# Patient Record
Sex: Female | Born: 1990 | Race: Black or African American | Hispanic: No | Marital: Single
Health system: Midwestern US, Community
[De-identification: ages and names within clinical notes are randomized; demographics above are authoritative.]

## PROBLEM LIST (undated history)

## (undated) ENCOUNTER — Inpatient Hospital Stay (HOSPITAL_COMMUNITY): Payer: Self-pay

## (undated) DIAGNOSIS — L309 Dermatitis, unspecified: Secondary | ICD-10-CM

## (undated) DIAGNOSIS — J302 Other seasonal allergic rhinitis: Secondary | ICD-10-CM

## (undated) DIAGNOSIS — R112 Nausea with vomiting, unspecified: Secondary | ICD-10-CM

---
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
## 2011-01-05 LAB — WET PREP, GENITAL

## 2011-01-20 ENCOUNTER — Emergency Department (HOSPITAL_COMMUNITY): Admission: EM | Admit: 2011-01-20 | Payer: Self-pay | Source: Home / Self Care

## 2011-03-09 ENCOUNTER — Emergency Department (HOSPITAL_COMMUNITY)
Admission: EM | Admit: 2011-03-09 | Discharge: 2011-03-09 | Disposition: A | Discharge status: Home / Self Care | Payer: Self-pay | Attending: Emergency Medicine | Admitting: Emergency Medicine

## 2011-03-09 DIAGNOSIS — N739 Female pelvic inflammatory disease, unspecified: Secondary | ICD-10-CM | POA: Insufficient documentation

## 2011-03-09 LAB — URINALYSIS, ROUTINE W REFLEX MICROSCOPIC
Bilirubin Urine: NEGATIVE
Nitrite: NEGATIVE
Specific Gravity, Urine: 1.026 (ref 1.005–1.030)
pH: 5.5 (ref 5.0–8.0)

## 2011-03-09 LAB — COMPREHENSIVE METABOLIC PANEL
CO2: 25 mEq/L (ref 19–32)
Calcium: 9 mg/dL (ref 8.4–10.5)
Chloride: 103 mEq/L (ref 96–112)
Creatinine, Ser: 0.69 mg/dL (ref 0.4–1.2)
GFR calc non Af Amer: >60 mL/min (ref >60)
Glucose, Bld: 78 mg/dL (ref 70–99)
Total Bilirubin: 0.3 mg/dL (ref 0.3–1.2)

## 2011-03-09 LAB — WET PREP, GENITAL
Trich, Wet Prep: NONE SEEN
Yeast Wet Prep HPF POC: NONE SEEN

## 2011-03-09 LAB — LIPASE, BLOOD: Lipase: 13 U/L (ref 11–59)

## 2011-03-09 LAB — DIFFERENTIAL
Basophils Absolute: 0 10*3/uL (ref 0.0–0.1)
Basophils Relative: 0 % (ref 0–1)
Eosinophils Absolute: 0.1 10*3/uL (ref 0.0–0.7)
Eosinophils Relative: 2 % (ref 0–5)
Monocytes Absolute: 0.3 10*3/uL (ref 0.1–1.0)
Neutro Abs: 3.2 10*3/uL (ref 1.7–7.7)

## 2011-03-09 LAB — URINE MICROSCOPIC-ADD ON

## 2011-03-09 LAB — CBC
MCH: 29.2 pg (ref 26.0–34.0)
MCHC: 33.5 g/dL (ref 30.0–36.0)
Platelets: 203 10*3/uL (ref 150–400)
RDW: 13.7 % (ref 11.5–15.5)

## 2011-03-09 LAB — POCT PREGNANCY, URINE: Preg Test, Ur: NEGATIVE

## 2011-03-10 LAB — GC/CHLAMYDIA PROBE AMP, GENITAL
Chlamydia, DNA Probe: NEGATIVE
GC Probe Amp, Genital: NEGATIVE

## 2011-05-02 ENCOUNTER — Inpatient Hospital Stay (INDEPENDENT_AMBULATORY_CARE_PROVIDER_SITE_OTHER)
Admission: RE | Admit: 2011-05-02 | Discharge: 2011-05-02 | Disposition: A | Discharge status: Home / Self Care | Payer: Self-pay | Source: Ambulatory Visit | Attending: Emergency Medicine | Admitting: Emergency Medicine

## 2011-05-02 DIAGNOSIS — F659 Paraphilia, unspecified: Secondary | ICD-10-CM

## 2011-05-02 DIAGNOSIS — K089 Disorder of teeth and supporting structures, unspecified: Secondary | ICD-10-CM

## 2011-05-02 DIAGNOSIS — J069 Acute upper respiratory infection, unspecified: Secondary | ICD-10-CM

## 2011-05-02 LAB — WET PREP, GENITAL: Yeast Wet Prep HPF POC: NONE SEEN

## 2011-05-03 LAB — POCT URINALYSIS DIP (DEVICE)
Glucose, UA: NEGATIVE mg/dL
Nitrite: NEGATIVE
Protein, ur: NEGATIVE mg/dL
Specific Gravity, Urine: 1.02 (ref 1.005–1.030)
Urobilinogen, UA: 1 mg/dL (ref 0.0–1.0)

## 2011-05-03 LAB — POCT PREGNANCY, URINE: Preg Test, Ur: NEGATIVE

## 2011-10-15 ENCOUNTER — Emergency Department (INDEPENDENT_AMBULATORY_CARE_PROVIDER_SITE_OTHER)
Admission: EM | Admit: 2011-10-15 | Discharge: 2011-10-15 | Disposition: A | Discharge status: Home / Self Care | Payer: Medicaid Other | Source: Home / Self Care | Attending: Emergency Medicine | Admitting: Emergency Medicine

## 2011-10-15 DIAGNOSIS — J029 Acute pharyngitis, unspecified: Secondary | ICD-10-CM

## 2011-10-15 HISTORY — DX: Dermatitis, unspecified: L30.9

## 2011-10-15 HISTORY — DX: Other seasonal allergic rhinitis: J30.2

## 2011-10-15 MED ORDER — AMOXICILLIN 500 MG PO TABS: 500.0000 mg | ORAL_TABLET | Freq: Two times a day (BID) | ORAL | Status: AC

## 2011-10-15 NOTE — ED Provider Notes (Signed)
History     CSN: [id number]  Arrival date & time 10/15/11  1350   First MD Initiated Contact with Patient 10/15/11 1355      Chief Complaint  Patient presents with  . Sore Throat  . Cough    (Consider location/radiation/quality/duration/timing/severity/associated sxs/prior treatment) HPI Comments: For the last 3 weeks been hurting on an off, this last week has been more sore and hurting with some cough and colored No fevers, but have been congested for more than a week  Patient is a 20 y.o. female presenting with pharyngitis and cough. The history is provided by the patient.  Sore Throat This is a new problem. The problem occurs constantly. The problem has not changed since onset.Pertinent negatives include no chest pain, no abdominal pain and no shortness of breath. The symptoms are aggravated by swallowing. She has tried acetaminophen for the symptoms.  Cough Pertinent negatives include no chest pain and no shortness of breath.    Past Medical History  Diagnosis Date  . Asthma   . Seasonal allergies   . Eczema     Past Surgical History  Procedure Date  . Leep     No family history on file.  History  Substance Use Topics  . Smoking status: Never Smoker   . Smokeless tobacco: Not on file  . Alcohol Use: Yes     occasional    OB History    Grav Para Term Preterm Abortions TAB SAB Ect Mult Living                  Review of Systems  Respiratory: Positive for cough. Negative for shortness of breath.   Cardiovascular: Negative for chest pain.  Gastrointestinal: Negative for abdominal pain.    Allergies  Review of patient's allergies indicates no known allergies.  Home Medications   Current Outpatient Rx  Name Route Sig Dispense Refill  . AMOXICILLIN 500 MG PO TABS Oral Take 1 tablet (500 mg total) by mouth 2 (two) times daily. 30 tablet 0    BP 116/63  Pulse 85  Temp(Src) 98.6 F (37 C) (Oral)  Resp 16  SpO2 100%  LMP 10/13/2011  Physical Exam    Nursing note and vitals reviewed. Constitutional: She appears well-developed and well-nourished. No distress.  HENT:  Head: Normocephalic.  Right Ear: Tympanic membrane normal.  Left Ear: Tympanic membrane normal.  Nose: No rhinorrhea.  Mouth/Throat: Uvula is midline and mucous membranes are normal. Posterior oropharyngeal erythema present. No posterior oropharyngeal edema.  Eyes: Conjunctivae are normal.  Neck: Neck supple.  Pulmonary/Chest: Effort normal and breath sounds normal.  Lymphadenopathy:    She has cervical adenopathy.  Neurological: She is alert.  Skin: Skin is warm. No erythema.    ED Course  Procedures (including critical care time)   Labs Reviewed  POCT RAPID STREP A ([facility] ONLY)   No results found.   1. Pharyngitis       MDM  Pharyngitis and residual cough        Jimmie Molly, MD 10/15/11 1920

## 2011-10-15 NOTE — ED Notes (Signed)
C/o sore throat and cough for 1 week.

## 2011-11-10 ENCOUNTER — Emergency Department (HOSPITAL_COMMUNITY)
Admission: EM | Admit: 2011-11-10 | Discharge: 2011-11-11 | Disposition: A | Discharge status: Home / Self Care | Payer: Medicaid Other | Attending: Emergency Medicine | Admitting: Emergency Medicine

## 2011-11-10 ENCOUNTER — Encounter (HOSPITAL_COMMUNITY): Payer: Self-pay | Admitting: Emergency Medicine

## 2011-11-10 DIAGNOSIS — N72 Inflammatory disease of cervix uteri: Secondary | ICD-10-CM

## 2011-11-10 DIAGNOSIS — J45909 Unspecified asthma, uncomplicated: Secondary | ICD-10-CM | POA: Insufficient documentation

## 2011-11-10 DIAGNOSIS — R109 Unspecified abdominal pain: Secondary | ICD-10-CM | POA: Insufficient documentation

## 2011-11-10 LAB — URINE MICROSCOPIC-ADD ON

## 2011-11-10 LAB — POCT PREGNANCY, URINE: Preg Test, Ur: NEGATIVE

## 2011-11-10 LAB — URINALYSIS, ROUTINE W REFLEX MICROSCOPIC
Glucose, UA: NEGATIVE mg/dL
Hgb urine dipstick: NEGATIVE
Specific Gravity, Urine: 1.014 (ref 1.005–1.030)
pH: 6 (ref 5.0–8.0)

## 2011-11-10 NOTE — ED Notes (Signed)
C/o intermittent sharp lower abd pain x 2 days.  Reports increased bowel movements.  Denies diarrhea.  Denies urinary complaints.

## 2011-11-11 MED ORDER — CEFTRIAXONE SODIUM 250 MG IJ SOLR
250.0000 mg | Freq: Once | INTRAMUSCULAR | Status: AC
  Administered 2011-11-11: 250 mg via INTRAMUSCULAR
  Filled 2011-11-11: qty 250

## 2011-11-11 MED ORDER — AZITHROMYCIN 250 MG PO TABS
1000.0000 mg | ORAL_TABLET | Freq: Once | ORAL | Status: AC
  Administered 2011-11-11: 1000 mg via ORAL
  Filled 2011-11-11: qty 4

## 2011-11-11 MED ORDER — ONDANSETRON 4 MG PO TBDP
4.0000 mg | ORAL_TABLET | Freq: Once | ORAL | Status: AC
  Administered 2011-11-11: 4 mg via ORAL
  Filled 2011-11-11: qty 1

## 2011-11-11 MED ORDER — METRONIDAZOLE 500 MG PO TABS: 500.0000 mg | ORAL_TABLET | Freq: Two times a day (BID) | ORAL | Status: AC

## 2011-11-11 MED ORDER — IBUPROFEN 600 MG PO TABS: 600.0000 mg | ORAL_TABLET | Freq: Four times a day (QID) | ORAL | Status: AC | PRN

## 2011-11-11 MED ORDER — LIDOCAINE HCL (PF) 1 % IJ SOLN
INTRAMUSCULAR | Status: AC
  Administered 2011-11-11: 1 mL
  Filled 2011-11-11: qty 5

## 2011-11-11 MED ORDER — TRAMADOL HCL 50 MG PO TABS
50.0000 mg | ORAL_TABLET | Freq: Once | ORAL | Status: AC
  Administered 2011-11-11: 50 mg via ORAL
  Filled 2011-11-11: qty 1

## 2011-11-11 MED ORDER — DOXYCYCLINE HYCLATE 100 MG PO CAPS: 100.0000 mg | ORAL_CAPSULE | Freq: Two times a day (BID) | ORAL | Status: AC

## 2011-11-11 NOTE — ED Notes (Signed)
Patient complaining of nausea

## 2011-11-11 NOTE — ED Provider Notes (Signed)
History     CSN: [id number]  Arrival date & time 11/10/11  2244   First MD Initiated Contact with Patient 11/11/11 0033      Chief Complaint  Patient presents with  . Abdominal Pain    (Consider location/radiation/quality/duration/timing/severity/associated sxs/prior treatment) Patient is a 21 y.o. female presenting with abdominal pain. The history is provided by the patient. No language interpreter was used.  Abdominal Pain The primary symptoms of the illness include abdominal pain and vaginal discharge. The primary symptoms of the illness do not include fever, fatigue, shortness of breath, nausea, vomiting, diarrhea or vaginal bleeding. The current episode started more than 2 days ago. The onset of the illness was gradual. The problem has not changed since onset. The abdominal pain began more than 2 days ago. The pain came on gradually. The abdominal pain has been gradually worsening since its onset. The abdominal pain is located in the suprapubic region. The abdominal pain does not radiate. The severity of the abdominal pain is 9/10. The abdominal pain is relieved by nothing. Exacerbated by: nothing.  The illness is associated with recent sexual activity. The patient states that she believes she is currently not pregnant. Risk factors: none. Symptoms associated with the illness do not include chills, anorexia, diaphoresis, constipation, urgency or frequency. Significant associated medical issues do not include HIV.    Past Medical History  Diagnosis Date  . Asthma   . Seasonal allergies   . Eczema     Past Surgical History  Procedure Date  . Leep     No family history on file.  History  Substance Use Topics  . Smoking status: Never Smoker   . Smokeless tobacco: Not on file  . Alcohol Use: Yes     occasional    OB History    Grav Para Term Preterm Abortions TAB SAB Ect Mult Living                  Review of Systems  Constitutional: Negative for fever, chills,  diaphoresis and fatigue.  HENT: Negative.   Eyes: Negative.   Respiratory: Negative for shortness of breath.   Cardiovascular: Negative.   Gastrointestinal: Positive for abdominal pain. Negative for nausea, vomiting, diarrhea, constipation and anorexia.  Genitourinary: Positive for vaginal discharge. Negative for urgency, frequency and vaginal bleeding.  Musculoskeletal: Negative.   Neurological: Negative.   Hematological: Negative.   Psychiatric/Behavioral: Negative.     Allergies  Review of patient's allergies indicates no known allergies.  Home Medications   Current Outpatient Rx  Name Route Sig Dispense Refill  . DOXYCYCLINE HYCLATE 100 MG PO CAPS Oral Take 1 capsule (100 mg total) by mouth 2 (two) times daily. 28 capsule 0  . METRONIDAZOLE 500 MG PO TABS Oral Take 1 tablet (500 mg total) by mouth 2 (two) times daily. 14 tablet 0    BP 126/79  Pulse 107  Temp(Src) 98 F (36.7 C) (Oral)  Resp 16  SpO2 96%  LMP 10/13/2011  Physical Exam  Constitutional: She is oriented to person, place, and time. She appears well-developed and well-nourished. No distress.  HENT:  Head: Normocephalic and atraumatic.  Mouth/Throat: Oropharynx is clear and moist.  Eyes: Conjunctivae are normal. Pupils are equal, round, and reactive to light.  Neck: Normal range of motion. Neck supple.  Cardiovascular: Normal rate and regular rhythm.   Pulmonary/Chest: Effort normal and breath sounds normal.  Abdominal: Soft. Bowel sounds are normal. There is no tenderness. There is no rebound and no  guarding.  Genitourinary: Cervix exhibits motion tenderness and discharge. Left adnexum displays tenderness. Vaginal discharge found.       Chaperone present  Musculoskeletal: Normal range of motion.  Neurological: She is alert and oriented to person, place, and time.  Skin: Skin is warm and dry.  Psychiatric: Thought content normal.    ED Course  Procedures (including critical care time)  Labs Reviewed   URINALYSIS, ROUTINE W REFLEX MICROSCOPIC - Abnormal; Notable for the following:    APPearance CLOUDY (*)    Leukocytes, UA TRACE (*)    All other components within normal limits  URINE MICROSCOPIC-ADD ON - Abnormal; Notable for the following:    Squamous Epithelial / LPF FEW (*)    Bacteria, UA FEW (*)    All other components within normal limits  WET PREP, GENITAL - Abnormal; Notable for the following:    Clue Cells, Wet Prep MODERATE (*)    WBC, Wet Prep HPF POC MANY (*)    All other components within normal limits  POCT PREGNANCY, URINE  POCT PREGNANCY, URINE  GC/CHLAMYDIA PROBE AMP, GENITAL   No results found.   1. Cervicitis       MDM  No sexual activity until 7 days after all partners treated take all antibiotics, no alcohol while taking flagyl.  Follow up in 1 week for repeat cultures at [facility] health department and follow up with your regular doctor for annual pap smear.  Patient verbalizes understanding and agrees to follow up       Shaquavia Whisonant Smitty Cords, MD 11/11/11 [phone]

## 2011-11-11 NOTE — Discharge Instructions (Signed)
Cervicitis Cervicitis is a soreness and swelling (inflammation) of the cervix. Your cervix is located at the bottom of your uterus which opens up to the vagina.  CAUSES   Sexually transmitted infections (STIs).   Allergic reaction.   Medicines or birth control devices that are put in the vagina.   Injury to the cervix.   Bacterial infections.  SYMPTOMS  There may be no symptoms. If symptoms occur, they may include:  Grey, white, yellow, or bad smelling vaginal discharge.   Pain or itching of the area outside the vagina.   Painful sexual intercourse.   Lower abdominal or lower back pain, especially during intercourse.   Frequent urination.   Abnormal vaginal bleeding between periods, after sexual intercourse, or after menopause.   Pressure or a heavy feeling in the pelvis.  DIAGNOSIS  Diagnosis is made after a pelvic exam. Other tests may include:  Examination of any discharge under a microscope (wet prep).   A Pap test.  TREATMENT  Treatment will depend on the cause of cervicitis. If it is caused by an STI, both you and your partner will need to be treated. Antibiotic medicines will be given. HOME CARE INSTRUCTIONS   Do not have sexual intercourse until your caregiver says it is okay.   Do not have sexual intercourse until your partner has been treated if your cervicitis is caused by an STI.   Take your antibiotics as directed. Finish them even if you start to feel better.  SEEK IMMEDIATE MEDICAL CARE IF:   Your symptoms come back.   You have a fever.   You experience any problems that may be related to the medicine you are taking.  MAKE SURE YOU:   Understand these instructions.   Will watch your condition.   Will get help right away if you are not doing well or get worse.  Document Released: 10/11/2005 Document Revised: 06/23/2011 Document Reviewed: 05/10/2011 [employer] Patient Information 2012 [employer]. 

## 2012-11-08 ENCOUNTER — Encounter (HOSPITAL_COMMUNITY): Payer: Self-pay | Admitting: Emergency Medicine

## 2012-11-08 ENCOUNTER — Emergency Department (HOSPITAL_COMMUNITY)
Admission: EM | Admit: 2012-11-08 | Discharge: 2012-11-08 | Disposition: A | Discharge status: Home / Self Care | Payer: Self-pay | Attending: Emergency Medicine | Admitting: Emergency Medicine

## 2012-11-08 ENCOUNTER — Emergency Department (HOSPITAL_COMMUNITY): Payer: Self-pay

## 2012-11-08 DIAGNOSIS — O9989 Other specified diseases and conditions complicating pregnancy, childbirth and the puerperium: Secondary | ICD-10-CM | POA: Insufficient documentation

## 2012-11-08 DIAGNOSIS — J45909 Unspecified asthma, uncomplicated: Secondary | ICD-10-CM | POA: Insufficient documentation

## 2012-11-08 DIAGNOSIS — O210 Mild hyperemesis gravidarum: Secondary | ICD-10-CM | POA: Insufficient documentation

## 2012-11-08 DIAGNOSIS — R1110 Vomiting, unspecified: Secondary | ICD-10-CM

## 2012-11-08 DIAGNOSIS — R059 Cough, unspecified: Secondary | ICD-10-CM | POA: Insufficient documentation

## 2012-11-08 DIAGNOSIS — R05 Cough: Secondary | ICD-10-CM | POA: Insufficient documentation

## 2012-11-08 DIAGNOSIS — Z872 Personal history of diseases of the skin and subcutaneous tissue: Secondary | ICD-10-CM | POA: Insufficient documentation

## 2012-11-08 DIAGNOSIS — Z9889 Other specified postprocedural states: Secondary | ICD-10-CM | POA: Insufficient documentation

## 2012-11-08 LAB — URINALYSIS, ROUTINE W REFLEX MICROSCOPIC
Bilirubin Urine: NEGATIVE
Hgb urine dipstick: NEGATIVE
Nitrite: NEGATIVE
Protein, ur: NEGATIVE mg/dL
Specific Gravity, Urine: 1.011 (ref 1.005–1.030)
Urobilinogen, UA: 0.2 mg/dL (ref 0.0–1.0)

## 2012-11-08 LAB — COMPREHENSIVE METABOLIC PANEL
ALT: 10 U/L (ref 0–35)
CO2: 23 mEq/L (ref 19–32)
Calcium: 9.1 mg/dL (ref 8.4–10.5)
Chloride: 97 mEq/L (ref 96–112)
GFR calc Af Amer: >90 mL/min (ref >90)
GFR calc non Af Amer: >90 mL/min (ref >90)
Glucose, Bld: 110 mg/dL — ABNORMAL HIGH (ref 70–99)
Sodium: 130 mEq/L — ABNORMAL LOW (ref 135–145)
Total Bilirubin: 0.3 mg/dL (ref 0.3–1.2)

## 2012-11-08 LAB — CBC WITH DIFFERENTIAL/PLATELET
Eosinophils Relative: 1 % (ref 0–5)
HCT: 38.2 % (ref 36.0–46.0)
Lymphocytes Relative: 13 % (ref 12–46)
Lymphs Abs: 0.9 10*3/uL (ref 0.7–4.0)
MCV: 87.4 fL (ref 78.0–100.0)
Monocytes Absolute: 1 10*3/uL (ref 0.1–1.0)
Neutro Abs: 4.8 10*3/uL (ref 1.7–7.7)
Platelets: 162 10*3/uL (ref 150–400)
RBC: 4.37 MIL/uL (ref 3.87–5.11)
WBC: 6.6 10*3/uL (ref 4.0–10.5)

## 2012-11-08 MED ORDER — SODIUM CHLORIDE 0.9 % IV BOLUS (SEPSIS)
2000.0000 mL | Freq: Once | INTRAVENOUS | Status: AC
  Administered 2012-11-08: 2000 mL via INTRAVENOUS

## 2012-11-08 MED ORDER — ACETAMINOPHEN 500 MG PO TABS
1000.0000 mg | ORAL_TABLET | Freq: Once | ORAL | Status: AC
Start: 2012-11-08 — End: 2012-11-08
  Administered 2012-11-08: 1000 mg via ORAL
  Filled 2012-11-08: qty 2

## 2012-11-08 MED ORDER — POTASSIUM CHLORIDE CRYS ER 20 MEQ PO TBCR
20.0000 meq | EXTENDED_RELEASE_TABLET | Freq: Once | ORAL | Status: AC
  Administered 2012-11-08: 20 meq via ORAL
  Filled 2012-11-08: qty 1

## 2012-11-08 MED ORDER — METOCLOPRAMIDE HCL 5 MG/ML IJ SOLN
10.0000 mg | Freq: Once | INTRAMUSCULAR | Status: AC
  Administered 2012-11-08: 10 mg via INTRAVENOUS
  Filled 2012-11-08: qty 2

## 2012-11-08 MED ORDER — METOCLOPRAMIDE HCL 10 MG PO TABS: 10.0000 mg | ORAL_TABLET | Freq: Four times a day (QID) | ORAL | Status: DC

## 2012-11-08 NOTE — ED Notes (Signed)
Pt sts weakness, nausea and vomitting, since 12/23.  Sts productive cough with green sputum started yesterday.  Denies pain.

## 2012-11-08 NOTE — ED Notes (Signed)
Patient transported to X-ray 

## 2012-11-08 NOTE — ED Notes (Signed)
Lab called and requested to re-collect urine specimen for U/A--- pt unable to give specimen at this time, will try after NS bolus.

## 2012-11-08 NOTE — ED Provider Notes (Signed)
History     CSN: [id number]  Arrival date & time 11/08/12  1752   First MD Initiated Contact with Patient 11/08/12 1845      Chief Complaint  Patient presents with  . Fatigue  . Pregnant   . Cough    (Consider location/radiation/quality/duration/timing/severity/associated sxs/prior treatment) The history is provided by the patient.  Erin Lin is a 22 y.o. female G2P1 LMP 11/25 here with hyperemesis. Since December 23 she has been feeling tired. She's been intermittently vomiting since then. Denies any fevers or chills but she developed some cough yesterday. Didn't have the flu shot. No vaginal discharge or abdominal pain. No prenatal care do to lack of insurance.   Past Medical History  Diagnosis Date  . Asthma   . Seasonal allergies   . Eczema     Past Surgical History  Procedure Date  . Leep     History reviewed. No pertinent family history.  History  Substance Use Topics  . Smoking status: Never Smoker   . Smokeless tobacco: Not on file  . Alcohol Use: Yes     Comment: occasional    OB History    Grav Para Term Preterm Abortions TAB SAB Ect Mult Living   1               Review of Systems  Respiratory: Positive for cough.   Gastrointestinal: Positive for vomiting.  All other systems reviewed and are negative.    Allergies  Review of patient's allergies indicates no known allergies.  Home Medications  No current outpatient prescriptions on file.  BP 138/73  Pulse 102  Temp 99 F (37.2 C) (Oral)  Resp 14  SpO2 100%  LMP 10/13/2011  Physical Exam  Nursing note and vitals reviewed. Constitutional: She is oriented to person, place, and time. She appears well-developed and well-nourished.  HENT:  Head: Normocephalic.       MM slightly dry   Eyes: Conjunctivae normal are normal. Pupils are equal, round, and reactive to light.  Neck: Normal range of motion. Neck supple.  Cardiovascular: Regular rhythm and normal heart sounds.        Tachy     Pulmonary/Chest: Effort normal and breath sounds normal. No respiratory distress. She has no wheezes. She has no rales.  Abdominal: Soft. Bowel sounds are normal. She exhibits no distension. There is no tenderness. There is no rebound.  Musculoskeletal: Normal range of motion.  Neurological: She is alert and oriented to person, place, and time.  Skin: Skin is warm and dry.  Psychiatric: She has a normal mood and affect. Her behavior is normal. Judgment and thought content normal.    ED Course  Procedures (including critical care time)  Labs Reviewed  CBC WITH DIFFERENTIAL - Abnormal; Notable for the following:    Monocytes Relative 15 (*)     All other components within normal limits  COMPREHENSIVE METABOLIC PANEL - Abnormal; Notable for the following:    Sodium 130 (*)     Potassium 3.3 (*)     Glucose, Bld 110 (*)     All other components within normal limits  URINALYSIS, ROUTINE W REFLEX MICROSCOPIC - Abnormal; Notable for the following:    Ketones, ur TRACE (*)     All other components within normal limits  POCT PREGNANCY, URINE - Abnormal; Notable for the following:    Preg Test, Ur POSITIVE (*)     All other components within normal limits   Dg Chest 2 View  11/08/2012  *RADIOLOGY REPORT*  Clinical Data: Cough and fever.  CHEST - 2 VIEW  Comparison: None.  Findings: The heart size is normal.  The lungs are clear.  The visualized soft tissues and bony thorax are unremarkable.  IMPRESSION: Negative two-view chest.   Original Report Authenticated By: Marin Roberts, M.D.      No diagnosis found.    MDM  Erin Lin is a 22 y.o. female [redacted] weeks pregnant here with vomiting, dehydration likely from hyperemesis. She also has fever 101. Will get labs, UA, CXR to r/o infection. Will give IVF and reglan and reassess.   9:00 PM [name]'s CXR nl. UA showed no bacturia. Labs otherwise unremarkable. Nontender abdomen and she is not having vaginal bleeding. I recommend that she  f/u with OB and get an outpatient ultrasound. I will prescribe reglan PO for hyperemesis. She was able to tolerate PO after 2L NS.         Richardean Canal, MD 11/08/12 2101

## 2012-11-08 NOTE — ED Notes (Signed)
Pt states that she is 7wks pregnancy and has felt very fatigued and has been unable to keep any fluids or food down since since Dec. 23rd. States she developed a cough yesterday. Denies unusual vaginal discharge. No prenatal care thus far.

## 2012-12-27 ENCOUNTER — Emergency Department (HOSPITAL_COMMUNITY): Payer: Medicaid Other

## 2012-12-27 ENCOUNTER — Encounter (HOSPITAL_COMMUNITY): Payer: Self-pay | Admitting: Emergency Medicine

## 2012-12-27 ENCOUNTER — Emergency Department (HOSPITAL_COMMUNITY)
Admission: EM | Admit: 2012-12-27 | Discharge: 2012-12-27 | Disposition: A | Discharge status: Home / Self Care | Payer: Medicaid Other | Attending: Emergency Medicine | Admitting: Emergency Medicine

## 2012-12-27 DIAGNOSIS — IMO0001 Reserved for inherently not codable concepts without codable children: Secondary | ICD-10-CM | POA: Insufficient documentation

## 2012-12-27 DIAGNOSIS — Z202 Contact with and (suspected) exposure to infections with a predominantly sexual mode of transmission: Secondary | ICD-10-CM | POA: Insufficient documentation

## 2012-12-27 DIAGNOSIS — K6289 Other specified diseases of anus and rectum: Secondary | ICD-10-CM | POA: Insufficient documentation

## 2012-12-27 DIAGNOSIS — M79609 Pain in unspecified limb: Secondary | ICD-10-CM | POA: Insufficient documentation

## 2012-12-27 DIAGNOSIS — M259 Joint disorder, unspecified: Secondary | ICD-10-CM | POA: Insufficient documentation

## 2012-12-27 DIAGNOSIS — O9989 Other specified diseases and conditions complicating pregnancy, childbirth and the puerperium: Secondary | ICD-10-CM | POA: Insufficient documentation

## 2012-12-27 DIAGNOSIS — Z872 Personal history of diseases of the skin and subcutaneous tissue: Secondary | ICD-10-CM | POA: Insufficient documentation

## 2012-12-27 DIAGNOSIS — J45909 Unspecified asthma, uncomplicated: Secondary | ICD-10-CM | POA: Insufficient documentation

## 2012-12-27 DIAGNOSIS — M899 Disorder of bone, unspecified: Secondary | ICD-10-CM | POA: Insufficient documentation

## 2012-12-27 DIAGNOSIS — N898 Other specified noninflammatory disorders of vagina: Secondary | ICD-10-CM | POA: Insufficient documentation

## 2012-12-27 DIAGNOSIS — R109 Unspecified abdominal pain: Secondary | ICD-10-CM | POA: Insufficient documentation

## 2012-12-27 DIAGNOSIS — Z3490 Encounter for supervision of normal pregnancy, unspecified, unspecified trimester: Secondary | ICD-10-CM

## 2012-12-27 DIAGNOSIS — F1010 Alcohol abuse, uncomplicated: Secondary | ICD-10-CM | POA: Insufficient documentation

## 2012-12-27 LAB — CBC WITH DIFFERENTIAL/PLATELET
Basophils Absolute: 0 10*3/uL (ref 0.0–0.1)
HCT: 36 % (ref 36.0–46.0)
Hemoglobin: 12.5 g/dL (ref 12.0–15.0)
Lymphocytes Relative: 23 % (ref 12–46)
Lymphs Abs: 2 10*3/uL (ref 0.7–4.0)
Monocytes Absolute: 0.5 10*3/uL (ref 0.1–1.0)
Monocytes Relative: 5 % (ref 3–12)
Neutro Abs: 6.1 10*3/uL (ref 1.7–7.7)
RBC: 4.2 MIL/uL (ref 3.87–5.11)
RDW: 13.2 % (ref 11.5–15.5)
WBC: 8.6 10*3/uL (ref 4.0–10.5)

## 2012-12-27 LAB — COMPREHENSIVE METABOLIC PANEL
AST: 15 U/L (ref 0–37)
CO2: 22 mEq/L (ref 19–32)
Chloride: 102 mEq/L (ref 96–112)
Creatinine, Ser: 0.5 mg/dL (ref 0.50–1.10)
GFR calc Af Amer: >90 mL/min (ref >90)
GFR calc non Af Amer: >90 mL/min (ref >90)
Glucose, Bld: 86 mg/dL (ref 70–99)
Total Bilirubin: 0.2 mg/dL — ABNORMAL LOW (ref 0.3–1.2)

## 2012-12-27 LAB — RPR: RPR Ser Ql: NONREACTIVE

## 2012-12-27 LAB — POCT PREGNANCY, URINE: Preg Test, Ur: POSITIVE — AB

## 2012-12-27 LAB — URINALYSIS, ROUTINE W REFLEX MICROSCOPIC
Bilirubin Urine: NEGATIVE
Hgb urine dipstick: NEGATIVE
Protein, ur: NEGATIVE mg/dL
Urobilinogen, UA: 1 mg/dL (ref 0.0–1.0)

## 2012-12-27 LAB — WET PREP, GENITAL

## 2012-12-27 LAB — HCG, QUANTITATIVE, PREGNANCY: hCG, Beta Chain, Quant, S: 62522 m[IU]/mL — ABNORMAL HIGH (ref <5)

## 2012-12-27 MED ORDER — SODIUM CHLORIDE 0.9 % IV BOLUS (SEPSIS)
1000.0000 mL | Freq: Once | INTRAVENOUS | Status: AC
  Administered 2012-12-27: 1000 mL via INTRAVENOUS

## 2012-12-27 NOTE — ED Notes (Addendum)
Pt states that she has been having leg pain and muscle spasms.  States she is [redacted] wks pregnant and has been drinking to excess and it makes her "not feel good".  States that she tried to use the bathroom today and it "felt like her tail was going to fall off".  When asked what this meant, pt states "i don't know, i don't know".  Pt states that she has no prenatal care at all during this pregnancy nor has she been taking prenatal vitamins.  Also states she wants to get checked for STDs because she has been having unprotected sex during this pregnancy.  LMP November 26th, 2013.  Pt is a poor historian and is acting strange during assessment; talking softly, saying "i don't know" when asked questions.

## 2012-12-27 NOTE — ED Provider Notes (Signed)
History     CSN: [id number]  Arrival date & time 12/27/12  1220   First MD Initiated Contact with Patient 12/27/12 1323      Chief Complaint  Patient presents with  . Leg Pain  . Pregnant   . Alcohol Problem  . Exposure to STD  . Rectal Pain    (Consider location/radiation/quality/duration/timing/severity/associated sxs/prior treatment) HPI Comments: She comes to the ER with complaints of pain in both of her legs. She reports a crampy pain that comes and goes at all different areas of the legs. She has not had any injury. There is no swelling, redness or change in color. She has not had any chest pain or difficulty breathing. Patient is concerned because she is approximately [redacted] weeks pregnant, has not had any prenatal care. She was told to come to the ER to be checked out. She has not had any vaginal bleeding but has had a slight discharge. No urinary frequency or dysuria. Patient also expressed a concern because she has drank recently. She drank a lot last night, to the point of becoming intoxicated. She does not drink daily, but occasionally when she drinks she drinks to excess. She is not suicidal or homicidal.  Patient is a 22 y.o. female presenting with leg pain, alcohol problem, and STD exposure.  Leg Pain Associated symptoms: no fever   Alcohol Problem Associated symptoms include abdominal pain.  Exposure to STD Associated symptoms include abdominal pain.    Past Medical History  Diagnosis Date  . Asthma   . Seasonal allergies   . Eczema     Past Surgical History  Procedure Laterality Date  . Leep      No family history on file.  History  Substance Use Topics  . Smoking status: Never Smoker   . Smokeless tobacco: Not on file  . Alcohol Use: Yes     Comment: occasional    OB History   Grav Para Term Preterm Abortions TAB SAB Ect Mult Living   1               Review of Systems  Constitutional: Negative for fever.  Gastrointestinal: Positive for  abdominal pain.  Genitourinary: Positive for vaginal discharge. Negative for vaginal bleeding.  Musculoskeletal: Positive for myalgias.  Psychiatric/Behavioral: Negative for suicidal ideas and self-injury.  All other systems reviewed and are negative.    Allergies  Review of patient's allergies indicates no known allergies.  Home Medications  No current outpatient prescriptions on file.  BP 131/80  Pulse 103  Temp(Src) 98.7 F (37.1 C) (Oral)  Resp 22  SpO2 98%  Physical Exam  Constitutional: She is oriented to person, place, and time. She appears well-developed and well-nourished. No distress.  HENT:  Head: Normocephalic and atraumatic.  Right Ear: Hearing normal.  Nose: Nose normal.  Mouth/Throat: Oropharynx is clear and moist and mucous membranes are normal.  Eyes: Conjunctivae and EOM are normal. Pupils are equal, round, and reactive to light.  Neck: Normal range of motion. Neck supple.  Cardiovascular: Normal rate, regular rhythm, S1 normal and S2 normal.  Exam reveals no gallop and no friction rub.   No murmur heard. Pulmonary/Chest: Effort normal and breath sounds normal. No respiratory distress. She exhibits no tenderness.  Abdominal: Soft. Normal appearance and bowel sounds are normal. There is no hepatosplenomegaly. There is no tenderness. There is no rebound, no guarding, no tenderness at McBurney's point and negative Murphy's sign. No hernia.  Genitourinary: Vagina normal and uterus normal.  Cervix exhibits discharge. Cervix exhibits no motion tenderness and no friability. Right adnexum displays no mass and no tenderness. Left adnexum displays no mass and no tenderness.  Musculoskeletal: Normal range of motion.  Neurological: She is alert and oriented to person, place, and time. She has normal strength. No cranial nerve deficit or sensory deficit. Coordination normal. GCS eye subscore is 4. GCS verbal subscore is 5. GCS motor subscore is 6.  Skin: Skin is warm, dry and  intact. No rash noted. No cyanosis.  Psychiatric: She has a normal mood and affect. Her speech is normal and behavior is normal. Thought content normal.    ED Course  Procedures (including critical care time)  Labs Reviewed  COMPREHENSIVE METABOLIC PANEL - Abnormal; Notable for the following:    Sodium 134 (*)    Albumin 3.4 (*)    Total Bilirubin 0.2 (*)    All other components within normal limits  URINALYSIS, ROUTINE W REFLEX MICROSCOPIC - Abnormal; Notable for the following:    Leukocytes, UA TRACE (*)    All other components within normal limits  HCG, QUANTITATIVE, PREGNANCY - Abnormal; Notable for the following:    hCG, Beta Chain, Quant, S [address]109 (*)    All other components within normal limits  POCT PREGNANCY, URINE - Abnormal; Notable for the following:    Preg Test, Ur POSITIVE (*)    All other components within normal limits  WET PREP, GENITAL  GC/CHLAMYDIA PROBE AMP  CBC WITH DIFFERENTIAL  URINE MICROSCOPIC-ADD ON  RPR   US Ob Limited  12/27/2012  *RADIOLOGY REPORT*  Clinical Data: 22 year old female.  Abdominal and pelvic pain.  LIMITED OBSTETRIC ULTRASOUND  Number of Fetuses: 1 Heart Rate: 144 bpm Movement: Present Presentation: Variable Placental Location: Posterior Previa: None Amniotic Fluid (Subjective): Normal  BPD:  2.72 cm     14 w  6 d         EDC:  06/21/2013  MATERNAL FINDINGS:  The left adnexa cannot be visualized.  The right ovary is within normal limits.  IMPRESSION: Viable singleton intrauterine pregnancy with estimated gestational age of [redacted] weeks and 6 days by biparietal diameter.  Recommend followup with non-emergent complete OB 14+ wk US examination for fetal biometric evaluation and anatomic survey if not already performed.   Original Report Authenticated By: Erskine Speed, M.D.      Diagnosis: 1. Intrauterine pregnancy 2. Leg cramps 3. Alcohol abuse    MDM  Patient comes to the ER for evaluation of multiple problems. She endorses occasional alcohol  abuse to excess. She does not think she is an alcoholic and is not expressing any suicidality or homicidality. She is not expressing any desire to talk to mental health about these issues. She is not a harm to herself.  Patient complaining of leg cramps. Examination is entirely normal. There is nothing to indicate DVT, both by examination as well as Wells criteria.  Patient was worked up for pregnancy with no prenatal care. Intrauterine pregnancy was confirmed. She has not had any bleeding. There was a small amount of white cervical discharge without cervical motion tenderness. Patient will be referred to OB/GYN.        Gilda Crease, MD 12/27/12 [phone]

## 2012-12-28 LAB — GC/CHLAMYDIA PROBE AMP: CT Probe RNA: NEGATIVE

## 2013-02-03 ENCOUNTER — Encounter (HOSPITAL_COMMUNITY): Payer: Self-pay | Admitting: *Deleted

## 2013-02-03 ENCOUNTER — Inpatient Hospital Stay (HOSPITAL_COMMUNITY)
Admission: AD | Admit: 2013-02-03 | Discharge: 2013-02-03 | Disposition: A | Discharge status: Home / Self Care | Payer: Medicaid Other | Source: Ambulatory Visit | Attending: Obstetrics & Gynecology | Admitting: Obstetrics & Gynecology

## 2013-02-03 DIAGNOSIS — M549 Dorsalgia, unspecified: Secondary | ICD-10-CM | POA: Insufficient documentation

## 2013-02-03 DIAGNOSIS — O99891 Other specified diseases and conditions complicating pregnancy: Secondary | ICD-10-CM | POA: Insufficient documentation

## 2013-02-03 DIAGNOSIS — R109 Unspecified abdominal pain: Secondary | ICD-10-CM | POA: Insufficient documentation

## 2013-02-03 DIAGNOSIS — W108XXA Fall (on) (from) other stairs and steps, initial encounter: Secondary | ICD-10-CM | POA: Insufficient documentation

## 2013-02-03 DIAGNOSIS — W19XXXA Unspecified fall, initial encounter: Secondary | ICD-10-CM

## 2013-02-03 DIAGNOSIS — Y92009 Unspecified place in unspecified non-institutional (private) residence as the place of occurrence of the external cause: Secondary | ICD-10-CM | POA: Insufficient documentation

## 2013-02-03 LAB — URINALYSIS, ROUTINE W REFLEX MICROSCOPIC
Glucose, UA: NEGATIVE mg/dL
Hgb urine dipstick: NEGATIVE
Ketones, ur: NEGATIVE mg/dL
Protein, ur: NEGATIVE mg/dL

## 2013-02-03 NOTE — MAU Provider Note (Signed)
  History     CSN: [id number]  Arrival date and time: 02/03/13 4098   First Provider Initiated Contact with Patient 02/03/13 2022      Chief Complaint  Patient presents with  . Fall  . Back Pain  . Abdominal Pain   HPI  Pt states she was coming down steps (4 steps) yesterday and fell. Landed on abdomen.  Reports back and abdomen pain today.  No report of bleeding or abnormal discharge. Feeling FM.  No pain at this time.  First prenatal care visit on 02/14/13, does not recall practice name.     Past Medical History  Diagnosis Date  . Seasonal allergies   . Eczema     Past Surgical History  Procedure Laterality Date  . Leep  2010    History reviewed. No pertinent family history.  History  Substance Use Topics  . Smoking status: Never Smoker   . Smokeless tobacco: Not on file  . Alcohol Use: No     Comment: occasional    Allergies: No Known Allergies  No prescriptions prior to admission    Review of Systems  Gastrointestinal: Positive for abdominal pain.  All other systems reviewed and are negative.   Physical Exam   Blood pressure 118/67, pulse 88, temperature 98.5 F (36.9 C), resp. rate 18, height 5\' 5"  (1.651 m), weight 59.966 kg (132 lb 3.2 oz), SpO2 100.00%.  Physical Exam  Constitutional: She is oriented to person, place, and time. She appears well-developed and well-nourished. No distress.  HENT:  Head: Normocephalic.  Neck: Normal range of motion. Neck supple.  Cardiovascular: Normal rate, regular rhythm and normal heart sounds.   Respiratory: Effort normal and breath sounds normal.  GI: Soft. There is no tenderness.  Genitourinary: No bleeding around the vagina. Vaginal discharge (mucusy) found.  Neurological: She is alert and oriented to person, place, and time.  Skin: Skin is warm and dry.  Dilation: Closed Effacement (%): Thick Cervical Position: Posterior Exam by:: Roney Marion, CNM  +FHR  MAU Course  Procedures  No results found  for this or any previous visit (from the past 24 hour(s)).  Assessment and Plan  Fall Reassuring Feta Well-Being  Plan: Provided reassurance Bleeding precautions Keep scheduled appt  [facility] 02/03/2013, 8:26 PM
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
## 2013-02-03 NOTE — MAU Provider Note (Signed)
Pt previable.    Attestation of Attending Supervision of Advanced Practitioner (CNM/NP): Evaluation and management procedures were performed by the Advanced Practitioner under my supervision and collaboration. I have reviewed the Advanced Practitioner's note and chart, and I agree with the management and plan.  Merilee Wible H. 10:36 PM

## 2013-12-09 ENCOUNTER — Encounter (HOSPITAL_COMMUNITY): Payer: Self-pay | Admitting: *Deleted

## 2014-08-26 ENCOUNTER — Encounter (HOSPITAL_COMMUNITY): Payer: Self-pay | Admitting: *Deleted

## 2015-12-24 ENCOUNTER — Emergency Department (HOSPITAL_COMMUNITY)
Admission: EM | Admit: 2015-12-24 | Discharge: 2015-12-24 | Disposition: A | Discharge status: Home / Self Care | Payer: Medicaid Other | Attending: Emergency Medicine | Admitting: Emergency Medicine

## 2015-12-24 ENCOUNTER — Encounter (HOSPITAL_COMMUNITY): Payer: Self-pay | Admitting: Family Medicine

## 2015-12-24 DIAGNOSIS — Z872 Personal history of diseases of the skin and subcutaneous tissue: Secondary | ICD-10-CM | POA: Insufficient documentation

## 2015-12-24 DIAGNOSIS — N760 Acute vaginitis: Secondary | ICD-10-CM | POA: Insufficient documentation

## 2015-12-24 DIAGNOSIS — Z113 Encounter for screening for infections with a predominantly sexual mode of transmission: Secondary | ICD-10-CM | POA: Insufficient documentation

## 2015-12-24 DIAGNOSIS — N390 Urinary tract infection, site not specified: Secondary | ICD-10-CM | POA: Insufficient documentation

## 2015-12-24 LAB — URINALYSIS, ROUTINE W REFLEX MICROSCOPIC
BILIRUBIN URINE: NEGATIVE
Glucose, UA: NEGATIVE mg/dL
HGB URINE DIPSTICK: NEGATIVE
Ketones, ur: 15 mg/dL — AB
Nitrite: NEGATIVE
PH: 6 (ref 5.0–8.0)
Protein, ur: NEGATIVE mg/dL
SPECIFIC GRAVITY, URINE: 1.029 (ref 1.005–1.030)

## 2015-12-24 LAB — WET PREP, GENITAL
CLUE CELLS WET PREP: NONE SEEN
SPERM: NONE SEEN
Trich, Wet Prep: NONE SEEN

## 2015-12-24 LAB — URINE MICROSCOPIC-ADD ON

## 2015-12-24 LAB — RPR: RPR: NONREACTIVE

## 2015-12-24 LAB — POC URINE PREG, ED: Preg Test, Ur: NEGATIVE

## 2015-12-24 LAB — HIV ANTIBODY (ROUTINE TESTING W REFLEX): HIV Screen 4th Generation wRfx: NONREACTIVE

## 2015-12-24 MED ORDER — NAPROXEN 500 MG PO TABS: 500.0000 mg | ORAL_TABLET | Freq: Two times a day (BID) | ORAL | Status: AC

## 2015-12-24 MED ORDER — HYDROCODONE-ACETAMINOPHEN 5-325 MG PO TABS
1.0000 | ORAL_TABLET | Freq: Once | ORAL | Status: AC
Start: 2015-12-24 — End: 2015-12-24
  Administered 2015-12-24: 1 via ORAL
  Filled 2015-12-24: qty 1

## 2015-12-24 MED ORDER — LIDOCAINE HCL (PF) 1 % IJ SOLN
0.9000 mL | Freq: Once | INTRAMUSCULAR | Status: AC
  Administered 2015-12-24: 0.9 mL
  Filled 2015-12-24: qty 5

## 2015-12-24 MED ORDER — FLUCONAZOLE 150 MG PO TABS: ORAL_TABLET | ORAL | Status: AC

## 2015-12-24 MED ORDER — CEPHALEXIN 500 MG PO CAPS: 500.0000 mg | ORAL_CAPSULE | Freq: Three times a day (TID) | ORAL | Status: AC

## 2015-12-24 MED ORDER — AZITHROMYCIN 250 MG PO TABS
1000.0000 mg | ORAL_TABLET | Freq: Once | ORAL | Status: AC
  Administered 2015-12-24: 1000 mg via ORAL
  Filled 2015-12-24: qty 4

## 2015-12-24 MED ORDER — CEFTRIAXONE SODIUM 250 MG IJ SOLR
250.0000 mg | Freq: Once | INTRAMUSCULAR | Status: AC
  Administered 2015-12-24: 250 mg via INTRAMUSCULAR
  Filled 2015-12-24: qty 250

## 2015-12-24 NOTE — ED Provider Notes (Signed)
CSN: [id number]     Arrival date & time 12/24/15  0815 History   First MD Initiated Contact with Patient 12/24/15 [phone]     Chief Complaint  Patient presents with  . Back Pain   HPI  Erin Lin is an 25 y.o. female who presents to the ED for evaluation of low back pain and vaginal discharge. She states she has had intermittent white vaginal discharge since Fall 2016. She states she was treated for Trich at that time but the discharge never went away. She states that for "a while" she has had burning with urination only after intercourse. She states for the past two days she has had severe low back pain. Denies abdominal pain. Denies urinary frequency/urgency, vaginal bleeding. Denies n/v/d. Denies fever or chills. She has not tried anything to alleviate her symptoms.   Past Medical History  Diagnosis Date  . Seasonal allergies   . Eczema    Past Surgical History  Procedure Laterality Date  . Leep  2010   No family history on file. Social History  Substance Use Topics  . Smoking status: Never Smoker   . Smokeless tobacco: Not on file  . Alcohol Use: No     Comment: occasional   OB History    Gravida Para Term Preterm AB TAB SAB Ectopic Multiple Living   Review of Systems  All other systems reviewed and are negative.     Allergies  Review of patient's allergies indicates no known allergies.  Home Medications   Prior to Admission medications   Not on File   BP 127/77 mmHg  Pulse 75  Temp(Src) 97.8 F (36.6 C) (Oral)  Resp 15  SpO2 100% Physical Exam  Constitutional: She is oriented to person, place, and time.  HENT:  Right Ear: External ear normal.  Left Ear: External ear normal.  Nose: Nose normal.  Mouth/Throat: Oropharynx is clear and moist. No oropharyngeal exudate.  Eyes: Conjunctivae and EOM are normal. Pupils are equal, round, and reactive to light.  Neck: Normal range of motion. Neck supple.  Cardiovascular: Normal rate, regular rhythm,  normal heart sounds and intact distal pulses.   Pulmonary/Chest: Effort normal and breath sounds normal. No respiratory distress. She has no wheezes. She exhibits no tenderness.  Abdominal: Soft. Bowel sounds are normal. She exhibits no distension. There is tenderness in the suprapubic area. There is no rebound and no guarding.  Marked R CVA tenderness  Genitourinary:  No external lesions. Vaginal canal with smooth white discharge and some thin watery green discharge. Cervix nonfriable. No CMT or adnexal tenderness.  Musculoskeletal: She exhibits no edema.  Neurological: She is alert and oriented to person, place, and time. No cranial nerve deficit.  Skin: Skin is warm and dry.  Psychiatric: She has a normal mood and affect.  Nursing note and vitals reviewed.   ED Course  Procedures (including critical care time) Labs Review Labs Reviewed  WET PREP, GENITAL - Abnormal; Notable for the following:    Yeast Wet Prep HPF POC PRESENT (*)    WBC, Wet Prep HPF POC MANY (*)    All other components within normal limits  URINALYSIS, ROUTINE W REFLEX MICROSCOPIC (NOT AT [facility]) - Abnormal; Notable for the following:    APPearance CLOUDY (*)    Ketones, ur 15 (*)    Leukocytes, UA SMALL (*)    All other components within normal limits  URINE  MICROSCOPIC-ADD ON - Abnormal; Notable for the following:    Squamous Epithelial / LPF 6-30 (*)    Bacteria, UA MANY (*)    All other components within normal limits  URINE CULTURE  HIV ANTIBODY (ROUTINE TESTING)  RPR  POC URINE PREG, ED  GC/CHLAMYDIA PROBE AMP ([place]) NOT AT [facility]    Imaging Review No results found. I have personally reviewed and evaluated these images and lab results as part of my medical decision-making.   EKG Interpretation None      MDM   Final diagnoses:  Urinary tract infection without hematuria, site unspecified  Vaginitis  Encounter for screening examination for sexually transmitted disease    Pain improved  with medication.Wet prep with yeast and many WBC. Given pt's symptoms we treated empirically with rocephin and azithromycin. GC chlamydia, HIV, and RPR sent.  UA shows small leuks, many bacteria. There is also evidence of contamination. However, given her symptoms will treat as UTI. Urine sent for culture, rx given for keflex. I encouraged safe sex practices. Instructed to f/u with PCP. ER return precautions given.    Carlene Coria, PA-C 12/24/15 1529  Gwyneth Sprout, MD 12/25/15 [phone]

## 2015-12-24 NOTE — Discharge Instructions (Signed)
You were seen in the ER today for evaluation of back pain and vaginal discharge. We treated you empirically today with antibiotics. Your urine did show signs of infection so I will give you a prescription for one week of antibiotics to take at home as well. You do have evidence of a yeast infection so I will give you a prescription for that as well. We will call you if any of your other tests come back positive.  Take medications as prescribed. Return to the emergency room for worsening condition or new concerning symptoms. Follow up with your regular doctor. If you don't have a regular doctor use one of the numbers below to establish a primary care doctor.   Emergency Department Resource Guide 1) Find a Doctor and Pay Out of Pocket Although you won't have to find out who is covered by your insurance plan, it is a good idea to ask around and get recommendations. You will then need to call the office and see if the doctor you have chosen will accept you as a new patient and what types of options they offer for patients who are self-pay. Some doctors offer discounts or will set up payment plans for their patients who do not have insurance, but you will need to ask so you aren't surprised when you get to your appointment.  2) Contact Your Local Health Department Not all health departments have doctors that can see patients for sick visits, but many do, so it is worth a call to see if yours does. If you don't know where your local health department is, you can check in your phone book. The CDC also has a tool to help you locate your state's health department, and many state websites also have listings of all of their local health departments.  3) Find a Walk-in Clinic If your illness is not likely to be very severe or complicated, you may want to try a walk in clinic. These are popping up all over the country in pharmacies, drugstores, and shopping centers. They're usually staffed by nurse practitioners or  physician assistants that have been trained to treat common illnesses and complaints. They're usually fairly quick and inexpensive. However, if you have serious medical issues or chronic medical problems, these are probably not your best option.  No Primary Care Doctor: - Call Health Connect at  [phone] - they can help you locate a primary care doctor that  accepts your insurance, provides certain services, etc. - Physician Referral [phone]  Emergency Department Resource Guide 1) Find a Doctor and Pay Out of Pocket Although you won't have to find out who is covered by your insurance plan, it is a good idea to ask around and get recommendations. You will then need to call the office and see if the doctor you have chosen will accept you as a new patient and what types of options they offer for patients who are self-pay. Some doctors offer discounts or will set up payment plans for their patients who do not have insurance, but you will need to ask so you aren't surprised when you get to your appointment.  2) Contact Your Local Health Department Not all health departments have doctors that can see patients for sick visits, but many do, so it is worth a call to see if yours does. If you don't know where your local health department is, you can check in your phone book. The CDC also has a tool to help you locate your  state's health department, and many state websites also have listings of all of their local health departments.  3) Find a Walk-in Clinic If your illness is not likely to be very severe or complicated, you may want to try a walk in clinic. These are popping up all over the country in pharmacies, drugstores, and shopping centers. They're usually staffed by nurse practitioners or physician assistants that have been trained to treat common illnesses and complaints. They're usually fairly quick and inexpensive. However, if you have serious medical issues or chronic medical problems,  these are probably not your best option.  No Primary Care Doctor: - Call Health Connect at  [phone] - they can help you locate a primary care doctor that  accepts your insurance, provides certain services, etc. - Physician Referral Service- [phone]  Chronic Pain Problems: Organization         Address  Phone   Notes  [facility]  [phone] Patients need to be referred by their primary care doctor.   Medication Assistance: Organization         Address  Phone   Notes  [facility] Medication [facility] [address] [place] [place], [place] [address] [phone] --Must be a resident of [facility] -- Must have NO insurance coverage whatsoever (no Medicaid/ Medicare, etc.) -- The pt. MUST have a primary care doctor that directs their care regularly and follows them in the community   [facility]  [phone]   [employer]  [phone]    Agencies that provide inexpensive medical care: Organization         Address  Phone   Notes  [facility]  [phone]   [facility]    [phone]   [facility] [address] [place], [place] [address] [phone]   [facility] [address] [place], [place] [phone]   [facility]    [phone]   [facility]    [phone]   [facility]  [address], [place] Phone:  [phone], Fax:  [phone] Hours of Operation:  9 am - 6 pm, M-F.  Also accepts Medicaid/Medicare and self-pay.  [facility]  [address], [place] Phone: [phone], Fax: [phone]. Hours of Operation:  8:30 am - 5:30 pm, M-F.  Also accepts Medicaid and self-pay.  [facility] [place] [facility] [address], [place] Phone: [phone]   [facility] [address] Natasha Bence [place], [place] [phone], Ext. [phone] Mondays &  Thursdays: 7-9 AM.  First 15 patients are seen on a first come, first serve basis.    Medicaid-accepting [facility] Providers:  Organization         Address  Phone   Notes  [facility] [address], [place] [phone] Also accepts self-pay patients.  [facility] [address] Laurell Josephs [place], [place]  [phone]   [facility] [address], [place] [phone]   [facility] [address] [place] [phone]   Renaye Rakers [address], [place]   [phone] Only accepts [place] patients after they have their name applied to their card.   Self-Pay (no insurance) in [facility]:  Organization  Address  Phone   Notes  Sickle Cell Patients, [facility] [place] [phone]   [facility] [place] [phone]   [facility] [place]  [address], [place] [phone]   [facility]/Dr. Osei-Bonsu  [address], [place] or [address], [place] [phone] Phone number for both [place] and [place] locations is the same.  Urgent [facility] and [facility] [address], [place] [phone]   [facility] [address] [place] or [address] [phone]   [facility] [address], [place] [phone], phone; [phone], fax Sees patients 1st and 3rd Saturday of every month.  Must not qualify for public or private insurance (i.e. Medicaid, Medicare, [place] Health Choice, Veterans' Benefits)  Household income should be no more than 200% of the poverty level The clinic cannot treat you if you are pregnant or think you are pregnant  Sexually transmitted diseases are not treated at the  clinic.

## 2015-12-24 NOTE — ED Notes (Signed)
Pt here for lower back pain and problems with urination after sex.

## 2015-12-25 LAB — GC/CHLAMYDIA PROBE AMP (~~LOC~~) NOT AT ARMC
CHLAMYDIA, DNA PROBE: NEGATIVE
NEISSERIA GONORRHEA: NEGATIVE

## 2015-12-25 LAB — URINE CULTURE

## 2015-12-28 ENCOUNTER — Telehealth (HOSPITAL_COMMUNITY): Payer: Self-pay

## 2016-07-15 ENCOUNTER — Emergency Department (HOSPITAL_COMMUNITY): Payer: No Typology Code available for payment source

## 2016-07-15 ENCOUNTER — Encounter (HOSPITAL_COMMUNITY): Payer: Self-pay | Admitting: Emergency Medicine

## 2016-07-15 ENCOUNTER — Emergency Department (HOSPITAL_COMMUNITY)
Admission: EM | Admit: 2016-07-15 | Discharge: 2016-07-16 | Disposition: A | Discharge status: Home / Self Care | Payer: No Typology Code available for payment source | Attending: Emergency Medicine | Admitting: Emergency Medicine

## 2016-07-15 DIAGNOSIS — Y939 Activity, unspecified: Secondary | ICD-10-CM | POA: Diagnosis not present

## 2016-07-15 DIAGNOSIS — Y999 Unspecified external cause status: Secondary | ICD-10-CM | POA: Diagnosis not present

## 2016-07-15 DIAGNOSIS — M25512 Pain in left shoulder: Secondary | ICD-10-CM | POA: Insufficient documentation

## 2016-07-15 DIAGNOSIS — Y92411 Interstate highway as the place of occurrence of the external cause: Secondary | ICD-10-CM | POA: Diagnosis not present

## 2016-07-15 LAB — POC URINE PREG, ED: PREG TEST UR: NEGATIVE

## 2016-07-15 MED ORDER — KETOROLAC TROMETHAMINE 60 MG/2ML IM SOLN
30.0000 mg | Freq: Once | INTRAMUSCULAR | Status: AC
  Administered 2016-07-15: 30 mg via INTRAMUSCULAR
  Filled 2016-07-15: qty 2

## 2016-07-15 MED ORDER — ACETAMINOPHEN 325 MG PO TABS
650.0000 mg | ORAL_TABLET | Freq: Once | ORAL | Status: AC
  Administered 2016-07-15: 650 mg via ORAL
  Filled 2016-07-15: qty 2
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
## 2016-07-16 MED ORDER — METHOCARBAMOL 500 MG PO TABS: 500.0000 mg | ORAL_TABLET | Freq: Two times a day (BID) | ORAL | 0 refills | Status: AC

## 2016-07-16 MED ORDER — NAPROXEN 500 MG PO TABS: 500.0000 mg | ORAL_TABLET | Freq: Two times a day (BID) | ORAL | 0 refills | Status: AC
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
## 2017-04-08 ENCOUNTER — Emergency Department: Admit: 2017-04-09 | Payer: Medicaid - Out of State

## 2017-04-08 ENCOUNTER — Emergency Department

## 2017-04-08 DIAGNOSIS — O26891 Other specified pregnancy related conditions, first trimester: Secondary | ICD-10-CM

## 2017-04-08 NOTE — ED Provider Notes (Signed)
HPI Comments: 26 y.o. female with past medical history significant for asthma who presents from home with chief complaint of nausea with vomiting. Pt reports 1 day hx of nausea accompanied by vomiting. She has developed a decreased appetite, upper abdominal "soreness" and a sore throat since vomiting began. Pt also has had diarrhea over the past 5 days. She notes that she had a positive pregnancy test this past week but is unsure how far along she is. She regularly has irregular menstrual cycles and is unsure when her last one was. Pt states that she has not had previous symptoms with prior pregnancies (G4P3). She denies vaginal discharge, vaginal bleeding or urinary symptoms. Pt also c/o chest and shortness of breath. She denies recent illness. No known sick contact. There are no other acute medical concerns at this time.    PCP: None    Note written by Terie PurserArielle L. Johnson, Scribe, as dictated by Jolie Strohecker Foust-Ward, PA 9:55 PM    The history is provided by the patient. No language interpreter was used.        Past Medical History:   Diagnosis Date   ??? Asthma        Past Surgical History:   Procedure Laterality Date   ??? HX LEEP PROCEDURE           History reviewed. No pertinent family history.    Social History     Social History   ??? Marital status: SINGLE     Spouse name: N/A   ??? Number of children: N/A   ??? Years of education: N/A     Occupational History   ??? Not on file.     Social History Main Topics   ??? Smoking status: Former Smoker   ??? Smokeless tobacco: Never Used   ??? Alcohol use Yes      Comment: daily   ??? Drug use: Yes     Special: Marijuana   ??? Sexual activity: Not on file     Other Topics Concern   ??? Not on file     Social History Narrative   ??? No narrative on file         ALLERGIES: Review of patient's allergies indicates no known allergies.    Review of Systems   Constitutional: Positive for fatigue. Negative for chills and fever.   HENT: Positive for sore throat (burning secondary ti vomiting). Negative  for congestion.    Respiratory: Positive for shortness of breath. Negative for cough.    Cardiovascular: Positive for chest pain (burning secondary to vomiting).   Gastrointestinal: Positive for abdominal pain (sore epigastric pain), diarrhea, nausea and vomiting. Negative for blood in stool and constipation.   Genitourinary: Negative for difficulty urinating, dysuria, vaginal bleeding and vaginal discharge.   Musculoskeletal: Negative for back pain and neck pain.   All other systems reviewed and are negative.      Vitals:    04/08/17 2114   BP: 141/85   Pulse: 99   Resp: 18   Temp: 98.3 ??F (36.8 ??C)   SpO2: 100%   Weight: 54 kg (119 lb)   Height: 5\' 4"  (1.626 m)            Physical Exam   Constitutional: She is oriented to person, place, and time. She appears well-developed and well-nourished. No distress.   Thin AAF in NAD   HENT:   Head: Normocephalic and atraumatic.   Right Ear: External ear normal.   Left Ear: External ear normal.  Nose: Nose normal.   Mouth/Throat: Oropharynx is clear and moist. No oropharyngeal exudate.   Eyes: Conjunctivae and EOM are normal. Pupils are equal, round, and reactive to light. Right eye exhibits no discharge. Left eye exhibits no discharge. No scleral icterus.   Neck: Normal range of motion. Neck supple.   Cardiovascular: Normal rate and regular rhythm.  Exam reveals no gallop and no friction rub.    No murmur heard.  Pulmonary/Chest: Effort normal and breath sounds normal. She has no wheezes. She has no rales.   Abdominal: Soft. Bowel sounds are normal. She exhibits no distension. There is tenderness (epigastric). There is no rebound and no guarding.   Neurological: She is alert and oriented to person, place, and time. No cranial nerve deficit. Coordination normal.   Skin: Skin is warm and dry. She is not diaphoretic.   Psychiatric: She has a normal mood and affect. Her behavior is normal.   Nursing note and vitals reviewed.       MDM   Number of Diagnoses or Management Options  Diagnosis management comments: 26 yo AAF G4 with complaint of diarrhea, nausea, vomiting and fatigue. Concern for acute gastroenteritis vs dehydration vs electrolyte derangement amongst others  Plan  CBC  CMP  Lipase  UA  IVF  reglan  Benadryl  [place] pelv  HCG qt  Reassess. Dheeraj Hail C [place]Ward, [place]         Amount and/or Complexity of Data Reviewed  Clinical lab tests: ordered and reviewed  Tests in the radiology section of CPT??: ordered and reviewed  Independent visualization of images, tracings, or specimens: yes          ED Course       Procedures         Progress note     Labs and imaging reviewed. Pt feeling much better. Tolerating PO. IUP noted on [place]. Malesha Suliman C Foust-Ward, [place]    Patient's results have been reviewed with them.  Patient and/or family have verbally conveyed their understanding and agreement of the patient's signs, symptoms, diagnosis, treatment and prognosis and additionally agree to follow up as recommended or return to the Emergency Room should their condition change prior to follow-up.  Discharge instructions have also been provided to the patient with some educational information regarding their diagnosis as well a list of reasons why they would want to return to the ER prior to their follow-up appointment should their condition change. Samual Beals C [place], [place]

## 2017-04-08 NOTE — ED Triage Notes (Addendum)
Pt to ED with c/o nausea, fatigue and diarrhea for the past 5 days. Pt hada positive pregnancy test this past Wednesday. Does not know how far along she is, unknown when last menstruation was. Pt reports having 1 mimosa today around 1pm.

## 2017-04-09 ENCOUNTER — Inpatient Hospital Stay
Admit: 2017-04-09 | Discharge: 2017-04-09 | Disposition: A | Discharge status: Home / Self Care | Payer: Medicaid - Out of State | Attending: Emergency Medicine

## 2017-04-09 LAB — BETA HCG, QT
Beta HCG, QT: 41778 m[IU]/mL — ABNORMAL HIGH (ref 0–6)
hCG Quant: 41778 m[IU]/mL — ABNORMAL HIGH (ref 0–6)

## 2017-04-09 LAB — URINALYSIS W/ RFLX MICROSCOPIC
Bacteria: NEGATIVE /hpf
Bilirubin: NEGATIVE
Blood: NEGATIVE
Glucose: NEGATIVE mg/dL
Ketone: 80 mg/dL — AB
Nitrites: NEGATIVE
Protein: 30 mg/dL — AB
Specific gravity: 1.027 (ref 1.003–1.030)
Urobilinogen: 0.2 EU/dL (ref 0.2–1.0)
pH (UA): 7 (ref 5.0–8.0)

## 2017-04-09 LAB — METABOLIC PANEL, COMPREHENSIVE
A-G Ratio: 1 — ABNORMAL LOW (ref 1.1–2.2)
ALT (SGPT): 19 U/L (ref 12–78)
AST (SGOT): 13 U/L — ABNORMAL LOW (ref 15–37)
Albumin: 4.5 g/dL (ref 3.5–5.0)
Alk. phosphatase: 60 U/L (ref 45–117)
Anion gap: 11 mmol/L (ref 5–15)
BUN/Creatinine ratio: 13 (ref 12–20)
BUN: 11 MG/DL (ref 6–20)
Bilirubin, total: 0.6 MG/DL (ref 0.2–1.0)
CO2: 22 mmol/L (ref 21–32)
Calcium: 9.5 MG/DL (ref 8.5–10.1)
Chloride: 104 mmol/L (ref 97–108)
Creatinine: 0.82 MG/DL (ref 0.55–1.02)
GFR est AA: >60 mL/min/{1.73_m2} (ref >60)
GFR est non-AA: >60 mL/min/{1.73_m2} (ref >60)
Globulin: 4.5 g/dL — ABNORMAL HIGH (ref 2.0–4.0)
Glucose: 91 mg/dL (ref 65–100)
Potassium: 3.7 mmol/L (ref 3.5–5.1)
Protein, total: 9 g/dL — ABNORMAL HIGH (ref 6.4–8.2)
Sodium: 137 mmol/L (ref 136–145)

## 2017-04-09 LAB — CBC WITH AUTOMATED DIFF
ABS. BASOPHILS: 0 10*3/uL (ref 0.0–0.1)
ABS. EOSINOPHILS: 0 10*3/uL (ref 0.0–0.4)
ABS. IMM. GRANS.: 0 10*3/uL (ref 0.00–0.04)
ABS. LYMPHOCYTES: 1.4 10*3/uL (ref 0.8–3.5)
ABS. MONOCYTES: 0.4 10*3/uL (ref 0.0–1.0)
ABS. NEUTROPHILS: 7.3 10*3/uL (ref 1.8–8.0)
ABSOLUTE NRBC: 0 10*3/uL (ref 0.00–0.01)
BASOPHILS: 0 % (ref 0–1)
EOSINOPHILS: 0 % (ref 0–7)
HCT: 42.2 % (ref 35.0–47.0)
HGB: 13.5 g/dL (ref 11.5–16.0)
IMMATURE GRANULOCYTES: 0 % (ref 0.0–0.5)
LYMPHOCYTES: 15 % (ref 12–49)
MCH: 25.8 PG — ABNORMAL LOW (ref 26.0–34.0)
MCHC: 32 g/dL (ref 30.0–36.5)
MCV: 80.5 FL (ref 80.0–99.0)
MONOCYTES: 4 % — ABNORMAL LOW (ref 5–13)
MPV: 9.9 FL (ref 8.9–12.9)
NEUTROPHILS: 80 % — ABNORMAL HIGH (ref 32–75)
NRBC: 0 PER 100 WBC
PLATELET: 221 10*3/uL (ref 150–400)
RBC: 5.24 M/uL — ABNORMAL HIGH (ref 3.80–5.20)
RDW: 17.2 % — ABNORMAL HIGH (ref 11.5–14.5)
WBC: 9.1 10*3/uL (ref 3.6–11.0)

## 2017-04-09 LAB — ETHYL ALCOHOL: ALCOHOL(ETHYL),SERUM: <10 MG/DL (ref <10)

## 2017-04-09 LAB — DRUG SCREEN, URINE
AMPHETAMINES: NEGATIVE
BARBITURATES: NEGATIVE
BENZODIAZEPINES: NEGATIVE
COCAINE: NEGATIVE
METHADONE: NEGATIVE
OPIATES: NEGATIVE
PCP(PHENCYCLIDINE): NEGATIVE
THC (TH-CANNABINOL): POSITIVE — AB

## 2017-04-09 LAB — LIPASE: Lipase: 80 U/L (ref 73–393)

## 2017-04-09 MED ORDER — SODIUM CHLORIDE 0.9 % IV
Freq: Once | INTRAVENOUS | Status: AC
Start: 2017-04-09 — End: 2017-04-08
  Administered 2017-04-09: 02:00:00 via INTRAVENOUS

## 2017-04-09 MED ORDER — ONDANSETRON 4 MG TAB, RAPID DISSOLVE
4 mg | ORAL_TABLET | Freq: Three times a day (TID) | ORAL | 0 refills | Status: AC | PRN
Start: 2017-04-09 — End: ?

## 2017-04-09 MED ORDER — DIPHENHYDRAMINE HCL 50 MG/ML IJ SOLN
50 mg/mL | INTRAMUSCULAR | Status: AC
Start: 2017-04-09 — End: 2017-04-08
  Administered 2017-04-09: 02:00:00 via INTRAVENOUS

## 2017-04-09 MED ORDER — METOCLOPRAMIDE 5 MG/ML IJ SOLN
5 mg/mL | INTRAMUSCULAR | Status: AC
Start: 2017-04-09 — End: 2017-04-08
  Administered 2017-04-09: 02:00:00 via INTRAVENOUS

## 2017-04-09 MED FILL — METOCLOPRAMIDE 5 MG/ML IJ SOLN: 5 mg/mL | INTRAMUSCULAR | Qty: 2

## 2017-04-09 MED FILL — SODIUM CHLORIDE 0.9 % IV: INTRAVENOUS | Qty: 1000

## 2017-04-09 MED FILL — DIPHENHYDRAMINE HCL 50 MG/ML IJ SOLN: 50 mg/mL | INTRAMUSCULAR | Qty: 1

## 2017-04-09 NOTE — ED Notes (Signed)
MD reviewed discharge instructions with the patient.  The patient verbalized understanding.    Patient is free of nausea and vomiting.  Patient ambulated out of the emergency department without assistance.  Patient is in no apparent distress.
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
